# Patient Record
Sex: Male | Born: 1990 | Race: White | Hispanic: No | Marital: Single | Smoking: Never smoker
Health system: Southern US, Community
[De-identification: ages and names within clinical notes are randomized; demographics above are authoritative.]

## PROBLEM LIST (undated history)

## (undated) HISTORY — PX: SHOULDER SURGERY: SHX246

## (undated) HISTORY — PX: MASTECTOMY: SHX3

---

## 2019-11-07 ENCOUNTER — Telehealth: Payer: Self-pay | Admitting: *Deleted

## 2019-11-07 ENCOUNTER — Emergency Department (HOSPITAL_COMMUNITY): Payer: BLUE CROSS/BLUE SHIELD

## 2019-11-07 ENCOUNTER — Encounter (HOSPITAL_COMMUNITY): Payer: Self-pay

## 2019-11-07 ENCOUNTER — Other Ambulatory Visit: Payer: Self-pay

## 2019-11-07 ENCOUNTER — Emergency Department (HOSPITAL_COMMUNITY)
Admission: EM | Admit: 2019-11-07 | Discharge: 2019-11-07 | Disposition: A | Discharge status: Home / Self Care | Payer: BLUE CROSS/BLUE SHIELD | Attending: Emergency Medicine | Admitting: Emergency Medicine

## 2019-11-07 DIAGNOSIS — R7989 Other specified abnormal findings of blood chemistry: Secondary | ICD-10-CM

## 2019-11-07 DIAGNOSIS — R197 Diarrhea, unspecified: Secondary | ICD-10-CM | POA: Diagnosis not present

## 2019-11-07 DIAGNOSIS — R1084 Generalized abdominal pain: Secondary | ICD-10-CM | POA: Insufficient documentation

## 2019-11-07 DIAGNOSIS — R112 Nausea with vomiting, unspecified: Secondary | ICD-10-CM | POA: Insufficient documentation

## 2019-11-07 DIAGNOSIS — Z79899 Other long term (current) drug therapy: Secondary | ICD-10-CM | POA: Insufficient documentation

## 2019-11-07 LAB — COMPREHENSIVE METABOLIC PANEL
ALT: 55 U/L — ABNORMAL HIGH (ref 0–44)
AST: 72 U/L — ABNORMAL HIGH (ref 15–41)
Albumin: 4.7 g/dL (ref 3.5–5.0)
Alkaline Phosphatase: 53 U/L (ref 38–126)
Anion gap: 12 (ref 5–15)
BUN: 10 mg/dL (ref 6–20)
CO2: 24 mmol/L (ref 22–32)
Calcium: 9.9 mg/dL (ref 8.9–10.3)
Chloride: 99 mmol/L (ref 98–111)
Creatinine, Ser: 0.83 mg/dL (ref 0.61–1.24)
GFR calc Af Amer: >60 mL/min (ref >60)
GFR calc non Af Amer: >60 mL/min (ref >60)
Glucose, Bld: 123 mg/dL — ABNORMAL HIGH (ref 70–99)
Potassium: 4.1 mmol/L (ref 3.5–5.1)
Sodium: 135 mmol/L (ref 135–145)
Total Bilirubin: 1.2 mg/dL (ref 0.3–1.2)
Total Protein: 8 g/dL (ref 6.5–8.1)

## 2019-11-07 LAB — CBC WITH DIFFERENTIAL/PLATELET
Abs Immature Granulocytes: 0.02 10*3/uL (ref 0.00–0.07)
Basophils Absolute: 0 10*3/uL (ref 0.0–0.1)
Basophils Relative: 1 %
Eosinophils Absolute: 0.1 10*3/uL (ref 0.0–0.5)
Eosinophils Relative: 2 %
HCT: 50.6 % (ref 39.0–52.0)
Hemoglobin: 16.8 g/dL (ref 13.0–17.0)
Immature Granulocytes: 0 %
Lymphocytes Relative: 16 %
Lymphs Abs: 1.1 10*3/uL (ref 0.7–4.0)
MCH: 32.6 pg (ref 26.0–34.0)
MCHC: 33.2 g/dL (ref 30.0–36.0)
MCV: 98.3 fL (ref 80.0–100.0)
Monocytes Absolute: 0.8 10*3/uL (ref 0.1–1.0)
Monocytes Relative: 12 %
Neutro Abs: 4.8 10*3/uL (ref 1.7–7.7)
Neutrophils Relative %: 69 %
Platelets: 209 10*3/uL (ref 150–400)
RBC: 5.15 MIL/uL (ref 4.22–5.81)
RDW: 12.5 % (ref 11.5–15.5)
WBC: 6.8 10*3/uL (ref 4.0–10.5)
nRBC: 0 % (ref 0.0–0.2)

## 2019-11-07 LAB — URINALYSIS, ROUTINE W REFLEX MICROSCOPIC
Bilirubin Urine: NEGATIVE
Glucose, UA: NEGATIVE mg/dL
Hgb urine dipstick: NEGATIVE
Ketones, ur: 20 mg/dL — AB
Leukocytes,Ua: NEGATIVE
Nitrite: NEGATIVE
Protein, ur: NEGATIVE mg/dL
Specific Gravity, Urine: 1.013 (ref 1.005–1.030)
pH: 6 (ref 5.0–8.0)

## 2019-11-07 LAB — I-STAT BETA HCG BLOOD, ED (MC, WL, AP ONLY): I-stat hCG, quantitative: <5 m[IU]/mL (ref <5)

## 2019-11-07 LAB — LIPASE, BLOOD: Lipase: 44 U/L (ref 11–51)

## 2019-11-07 MED ORDER — DICYCLOMINE HCL 20 MG PO TABS: 20.0000 mg | ORAL_TABLET | Freq: Two times a day (BID) | ORAL | 0 refills | Status: AC | PRN

## 2019-11-07 MED ORDER — ONDANSETRON 4 MG PO TBDP: 4.0000 mg | ORAL_TABLET | Freq: Three times a day (TID) | ORAL | 0 refills | Status: AC | PRN

## 2019-11-07 MED ORDER — IOHEXOL 300 MG/ML  SOLN
100.0000 mL | Freq: Once | INTRAMUSCULAR | Status: AC | PRN
  Administered 2019-11-07: 100 mL via INTRAVENOUS

## 2019-11-07 MED ORDER — SODIUM CHLORIDE 0.9 % IV BOLUS
1000.0000 mL | Freq: Once | INTRAVENOUS | Status: AC
  Administered 2019-11-07: 1000 mL via INTRAVENOUS

## 2019-11-07 MED ORDER — SODIUM CHLORIDE (PF) 0.9 % IJ SOLN
INTRAMUSCULAR | Status: AC
  Filled 2019-11-07: qty 50

## 2019-11-07 MED ORDER — ACETAMINOPHEN 500 MG PO TABS: 500.0000 mg | ORAL_TABLET | Freq: Four times a day (QID) | ORAL | 0 refills | Status: AC | PRN

## 2019-11-07 NOTE — ED Notes (Signed)
Pt given gingerale and crackers 

## 2019-11-07 NOTE — ED Triage Notes (Signed)
Patient c/o intermittent right mid abdominal pain, N/v/D x 4-5 days, but worse x the past 3 days.

## 2019-11-07 NOTE — Telephone Encounter (Signed)
TOC CM received call from pt stating his Bentyl Rx was not at his pharmacy. Contacted pharmacy and they did receive RX. Call back to pt to make him aware his medications are ready. Isidoro Donning RN CCM, [facility] ED TOC CM [phone]

## 2019-11-07 NOTE — Discharge Instructions (Addendum)
Your work-up today showed some inflammation of the bowels.  Also showed some fatty liver disease.  1. Medications: You can take [phone] mg of Tylenol every 6 hours as needed for pain. Do not exceed 4000 mg of Tylenol daily.  Take Zofran as needed for nausea.  Let this medicine dissolve under the tongue and wait around 10-20 minutes before eating or drinking after taking this medication.  You can take Bentyl as needed for crampy abdominal pain. 2. Treatment: rest, drink plenty of fluids, advance diet slowly.  Start with water and broth then advance to bland foods that will not upset your stomach such as crackers, mashed potatoes, and peanut butter.  You may also find it helpful to start taking a probiotic or eating yogurt to help reculture good bacteria in the gut. 3. Follow Up: Please followup with your primary doctor in 3 days for discussion of your diagnoses and further evaluation after today's visit; if you do not have a primary care doctor use the resource guide provided to find one; Please return to the ER for persistent vomiting, high fevers or worsening symptoms

## 2019-11-07 NOTE — ED Provider Notes (Signed)
[facility]-EMERGENCY DEPT Provider Note   CSN: [id number] Arrival date & time: 11/07/19  1057     History Chief Complaint  Patient presents with  . Abdominal Pain  . Emesis  . Diarrhea    Juan Moon is a 29 y.o. trans male presents for evaluation of acute onset, persistent abdominal pain with associated nausea and vomiting for 5 days.  Reports he has had similar symptoms previously but never this severe.  Pain is intermittent midline but just off to the right upper abdomen, no aggravating or alleviating factors noted.  Pain will radiate at times to the right lower quadrant of the abdomen.  He has had multiple episodes of nonbloody nonbilious emesis over the last few days, last vomited yesterday morning.  Has been able to tolerate protein shakes since then.  Had a few episodes of softer stools but denies diarrhea, constipation, melena, or hematochezia.  Notes some mild urinary frequency but denies urgency, dysuria, or hematuria.  No fevers, chest pain, or shortness of breath.  Takes Depo testosterone and does not currently have any menstrual cycles.  No history of prior abdominal surgeries.  Took ibuprofen and Dramamine with some improvement in symptoms temporarily.  The history is provided by the patient.       History reviewed. No pertinent past medical history.  There are no problems to display for this patient.   Past Surgical History:  Procedure Laterality Date  . MASTECTOMY    . SHOULDER SURGERY         Family History  Problem Relation Age of Onset  . Hypertension Mother   . Bipolar disorder Mother     Social History   Tobacco Use  . Smoking status: Never Smoker  . Smokeless tobacco: Never Used  Substance Use Topics  . Alcohol use: Yes  . Drug use: Yes    Types: Marijuana    Home Medications Prior to Admission medications   Medication Sig Start Date End Date Taking? Authorizing Provider  buPROPion (WELLBUTRIN SR) 100 MG 12 hr  tablet Take 100 mg by mouth daily. 09/22/19  Yes [provider]  testosterone cypionate (DEPOTESTOSTERONE CYPIONATE) 200 MG/ML injection Inject 0.5 mLs into the skin once a week. 09/22/19  Yes [provider]  acetaminophen (TYLENOL) 500 MG tablet Take 1 tablet (500 mg total) by mouth every 6 (six) hours as needed. 11/07/19   Domenico Achord A, PA-C  dicyclomine (BENTYL) 20 MG tablet Take 1 tablet (20 mg total) by mouth 2 (two) times daily as needed for spasms. 11/07/19   Luevenia Maxin, Fleurette Woolbright A, PA-C  ondansetron (ZOFRAN ODT) 4 MG disintegrating tablet Take 1 tablet (4 mg total) by mouth every 8 (eight) hours as needed for nausea or vomiting. 11/07/19   Luevenia Maxin, Madicyn Mesina A, PA-C    Allergies    Amoxicillin  Review of Systems   Review of Systems  Constitutional: Negative for chills and fever.  Gastrointestinal: Positive for abdominal pain, nausea and vomiting.  Genitourinary: Positive for frequency. Negative for dysuria, hematuria and urgency.  All other systems reviewed and are negative.   Physical Exam Updated Vital Signs BP (!) 136/94   Pulse 94   Temp 98.7 F (37.1 C) (Oral)   Resp 16   Ht 5\' 7"  (1.702 m)   Wt 79.4 kg   SpO2 100%   BMI 27.41 kg/m   Physical Exam Vitals and nursing note reviewed.  Constitutional:      General: He is not in acute distress.  Appearance: He is well-developed.  HENT:     Head: Normocephalic and atraumatic.  Eyes:     General:        Right eye: No discharge.        Left eye: No discharge.     Conjunctiva/sclera: Conjunctivae normal.  Neck:     Vascular: No JVD.     Trachea: No tracheal deviation.  Cardiovascular:     Rate and Rhythm: Regular rhythm. Tachycardia present.     Heart sounds: Normal heart sounds.  Pulmonary:     Effort: Pulmonary effort is normal.     Breath sounds: Normal breath sounds.  Abdominal:     General: Bowel sounds are normal. There is no distension.     Palpations: Abdomen is soft.     Tenderness: There is no  abdominal tenderness. There is no right CVA tenderness, left CVA tenderness, guarding or rebound.  Skin:    General: Skin is warm and dry.     Findings: No erythema.  Neurological:     Mental Status: He is alert.  Psychiatric:        Behavior: Behavior normal.     ED Results / Procedures / Treatments   Labs (all labs ordered are listed, but only abnormal results are displayed) Labs Reviewed  COMPREHENSIVE METABOLIC PANEL - Abnormal; Notable for the following components:      Result Value   Glucose, Bld 123 (*)    AST 72 (*)    ALT 55 (*)    All other components within normal limits  URINALYSIS, ROUTINE W REFLEX MICROSCOPIC - Abnormal; Notable for the following components:   Ketones, ur 20 (*)    All other components within normal limits  CBC WITH DIFFERENTIAL/PLATELET  LIPASE, BLOOD  I-STAT BETA HCG BLOOD, ED ([facility], [facility], [facility] ONLY)    EKG None  Radiology CT ABDOMEN PELVIS W CONTRAST  Result Date: 11/07/2019 CLINICAL DATA:  Abdominal abscess/infection suspected Nausea/vomiting Right mid abdominal pain. EXAM: CT ABDOMEN AND PELVIS WITH CONTRAST TECHNIQUE: Multidetector CT imaging of the abdomen and pelvis was performed using the standard protocol following bolus administration of intravenous contrast. CONTRAST:  OMNIPAQUE IOHEXOL 300 MG/ML  SOLN COMPARISON:  None. FINDINGS: Lower chest: Lung bases are clear. Hepatobiliary: Borderline hepatic steatosis. No focal hepatic lesion. Gallbladder physiologically distended, no calcified stone. No biliary dilatation. Pancreas: No ductal dilatation or inflammation. Spleen: Normal in size without focal abnormality. Adrenals/Urinary Tract: Normal adrenal glands. No hydronephrosis or perinephric edema. Homogeneous renal enhancement. Urinary bladder is partially distended without wall thickening. Stomach/Bowel: Stomach is unremarkable. Normal positioning of the ligament of Treitz. Scattered fluid-filled nondilated small bowel primarily in the  lower abdomen. No definite small bowel wall thickening or inflammation. Normal appendix well visualized in the right lower quadrant. There is submucosal fatty infiltration involving the ascending, transverse, and descending colon. Possible mild pericolonic edema/wall thickening involving the proximally ascending, series 2, image 45. Small volume of stool in the sigmoid. Vascular/Lymphatic: Normal caliber abdominal aorta. Retroaortic left renal vein. The portal vein is patent. No enlarged lymph nodes in the abdomen or pelvis. Reproductive: Uterus and bilateral adnexa are unremarkable. Other: No free air, free fluid, or intra-abdominal fluid collection. Musculoskeletal: There are no acute or suspicious osseous abnormalities. IMPRESSION: 1. Submucosal fatty infiltration involving the ascending, transverse, and descending colon, suggesting chronic inflammation. Possible mild pericolonic edema/wall thickening involving the proximally ascending colon, may reflect mild colitis. 2. Scattered fluid-filled nondilated small bowel, may be normal or can be seen with  enteritis. 3. Normal appendix. 4. Borderline hepatic steatosis. Electronically Signed   By: Keith Rake M.D.   On: 11/07/2019 14:04   US Abdomen Limited RUQ  Result Date: 11/07/2019 CLINICAL DATA:  Right upper quadrant pain.  Nausea and vomiting. EXAM: ULTRASOUND ABDOMEN LIMITED RIGHT UPPER QUADRANT COMPARISON:  None. FINDINGS: Gallbladder: Physiologically distended. No gallstones or wall thickening visualized. No sonographic Murphy sign noted by sonographer. Common bile duct: Diameter: 4 mm, normal. Liver: No focal lesion identified. Within normal limits in parenchymal echogenicity. Portal vein is patent on color Doppler imaging with normal direction of blood flow towards the liver. Other: No right upper quadrant ascites. IMPRESSION: Unremarkable right upper quadrant ultrasound. Electronically Signed   By: Keith Rake M.D.   On: 11/07/2019 13:41     Procedures Procedures (including critical care time)  Medications Ordered in ED Medications  sodium chloride (PF) 0.9 % injection (0 mLs  Hold 11/07/19 1507)  iohexol (OMNIPAQUE) 300 MG/ML solution 100 mL (100 mLs Intravenous Contrast Given 11/07/19 1332)  sodium chloride 0.9 % bolus 1,000 mL (0 mLs Intravenous Stopped 11/07/19 1452)    ED Course  I have reviewed the triage vital signs and the nursing notes.  Pertinent labs & imaging results that were available during my care of the patient were reviewed by me and considered in my medical decision making (see chart for details).    MDM Rules/Calculators/A&P                      Patient presenting for evaluation of intermittent abdominal pains with nausea vomiting and diarrhea.  Patient is afebrile, initially tachycardic but vital signs otherwise stable.  He is nontoxic in appearance.  No rebound or guarding noted on examination of the abdomen and patient is entirely nontender on initial abdominal examination.  Lab work reviewed and interpreted by myself shows no leukocytosis, no anemia, no metabolic derangements, no renal insufficiency.  UA suggest some dehydration, patient was given IV fluids in the ED.  LFTs are mildly elevated but total bilirubin is within normal limits and imaging today shows no evidence of acute cholecystitis or choledocholithiasis.  CT scan does show borderline hepatic steatosis so this could explain the patient's elevated LFTs.  I informed the patient of this finding, recommended low-fat diet and recommend follow-up with PCP for reevaluation and further monitoring.   CT scan shows evidence of colitis/enteritis, no acute surgical abdominal pathology.  On reevaluation patient is resting comfortably in no apparent distress.  Serial abdominal examinations remain benign and he is tolerating sips of fluid and crackers without difficulty.  Will treat symptomatically, recommend advancing diet slowly.  Again emphasized the  importance of follow-up with PCP for reevaluation of symptoms and recheck of LFTs/hepatic steatosis.  Discussed strict ED return precautions. Patient verbalized understanding of and agreement with plan and is safe for discharge home at this time.     Final Clinical Impression(s) / ED Diagnoses Final diagnoses:  Elevated LFTs  Nausea vomiting and diarrhea  Generalized abdominal pain    Rx / DC Orders ED Discharge Orders         Ordered    ondansetron (ZOFRAN ODT) 4 MG disintegrating tablet  Every 8 hours PRN     11/07/19 1518    acetaminophen (TYLENOL) 500 MG tablet  Every 6 hours PRN     11/07/19 1518    dicyclomine (BENTYL) 20 MG tablet  2 times daily PRN     11/07/19 1518  Jeanie Sewer, PA-C 11/07/19 1603    Bethann Berkshire, MD 11/12/19 1035

## 2020-09-20 IMAGING — CT CT ABD-PELV W/ CM
2 of 4 series · 15 of 46 positions shown, 17 images · IV contrast (OMNIPAQUE)
Comparison: None.

CLINICAL DATA: Abdominal abscess/infection suspected
Nausea/vomiting

Right mid abdominal pain.
EXAM:
CT ABDOMEN AND PELVIS WITH CONTRAST
TECHNIQUE: Multidetector CT imaging of the abdomen and pelvis was performed
using the standard protocol following bolus administration of
intravenous contrast.
CONTRAST:  100mL OMNIPAQUE IOHEXOL 300 MG/ML  SOLN

[Series 2: axial st · axial · 0.78mm/px · z∈[+1031,+1456]mm · 12 of 99 slices shown, 14 images]
[im 7/99  soft-tissue]
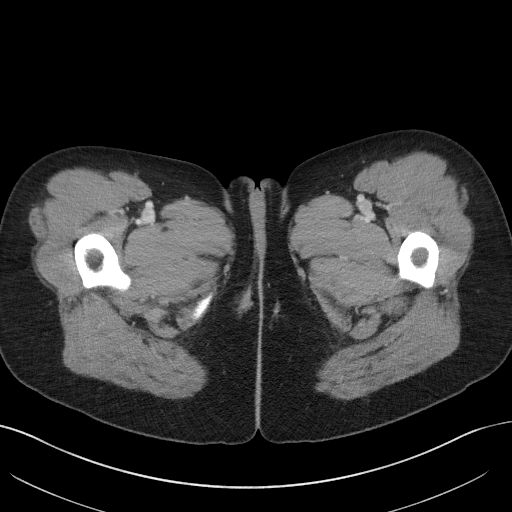
[im 7/99  bone]
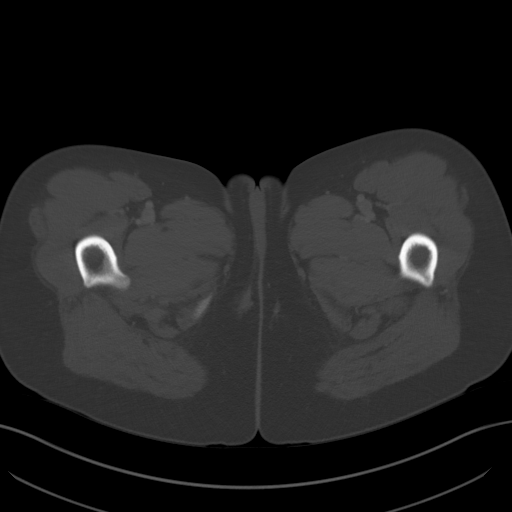
[im 13/99  soft-tissue]
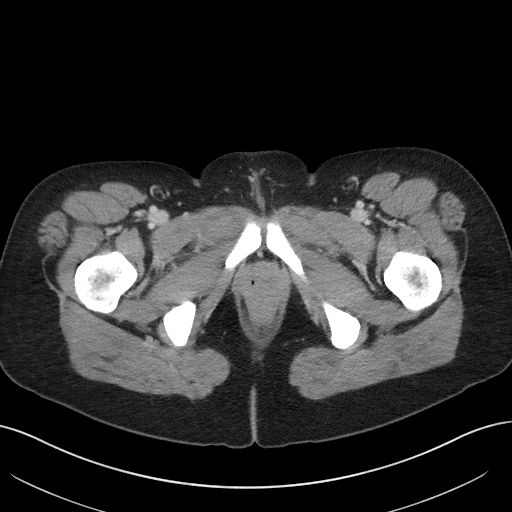
[im 25/99  soft-tissue]
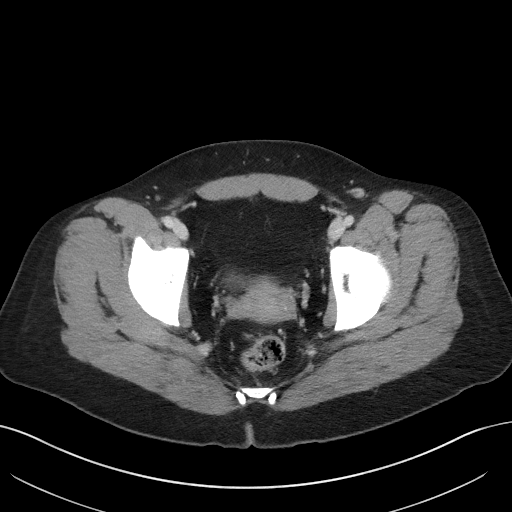
[im 31/99  soft-tissue]
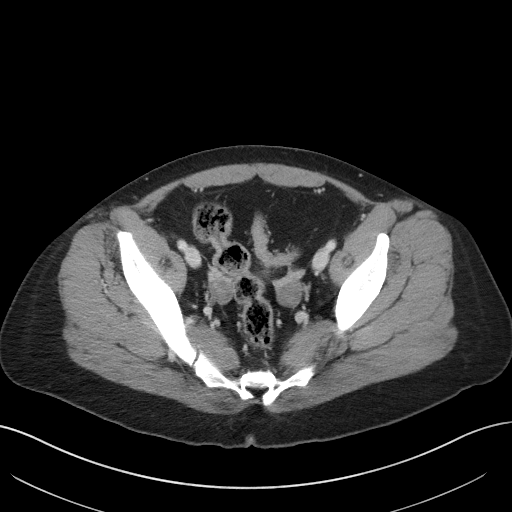
[im 37/99  soft-tissue]
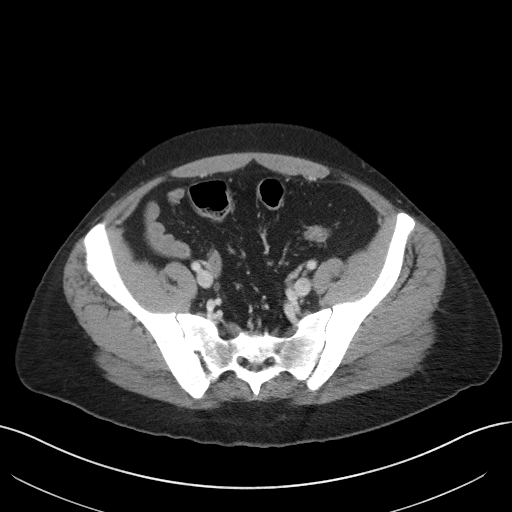
[im 43/99  soft-tissue]
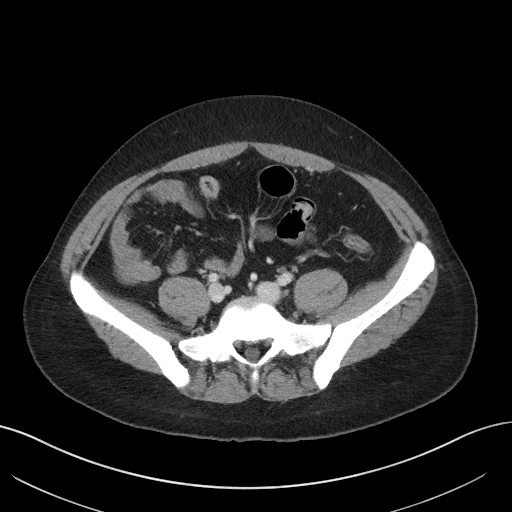
[im 56/99  soft-tissue]
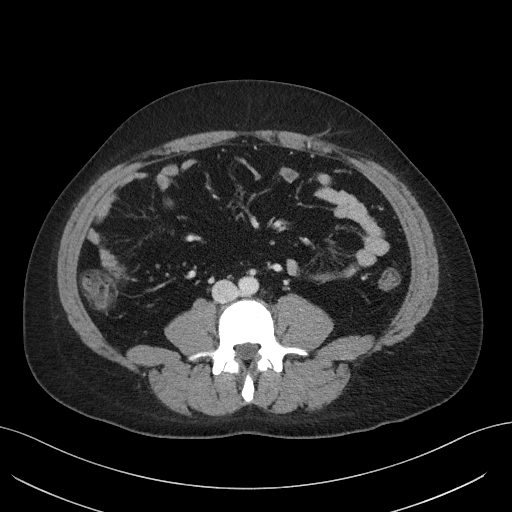
[im 62/99  soft-tissue]
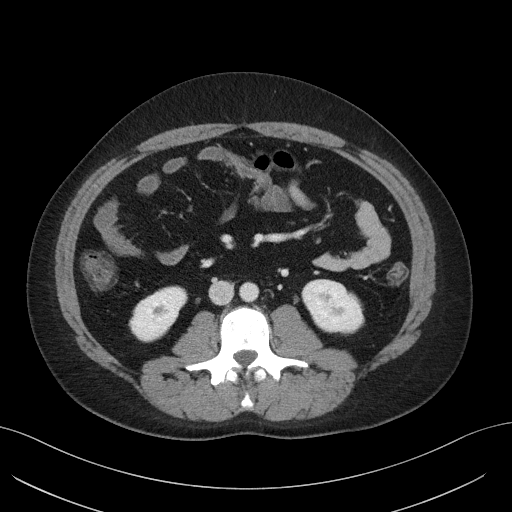
[im 68/99  soft-tissue]
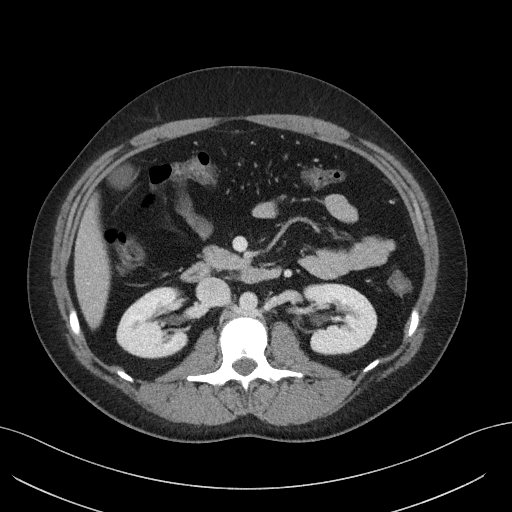
[im 68/99  bone]
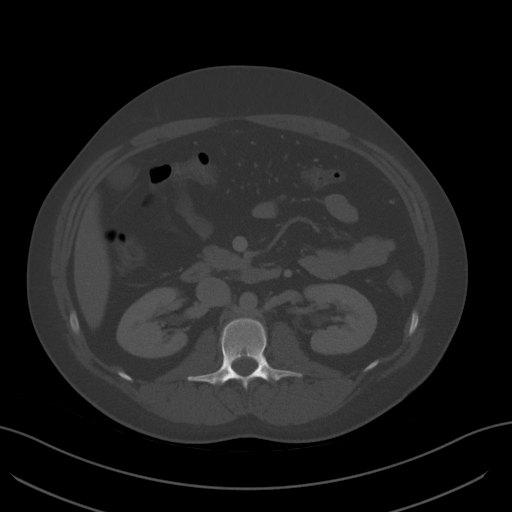
[im 74/99  soft-tissue]
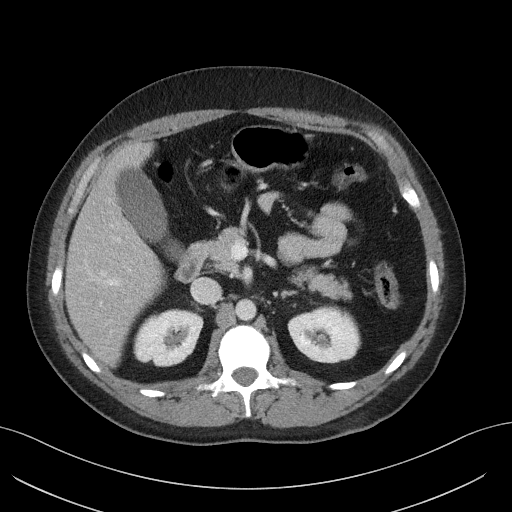
[im 86/99  soft-tissue]
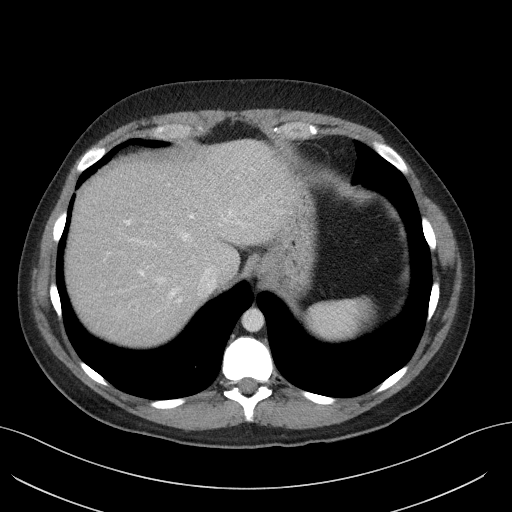
[im 92/99  soft-tissue]
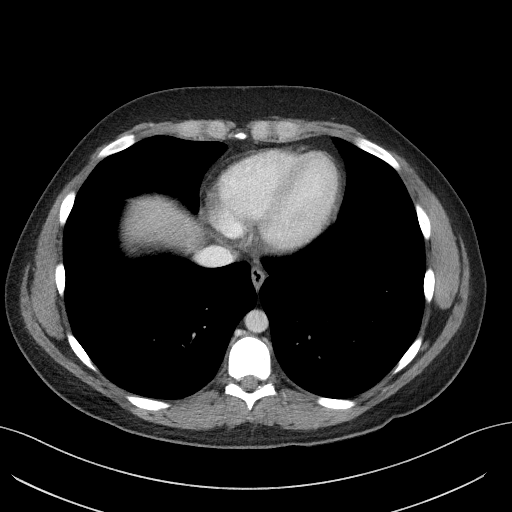

[Series 5: coronal st · coronal · 0.73mm/px · 3 of 101 slices shown]
[im 34/101  soft-tissue]
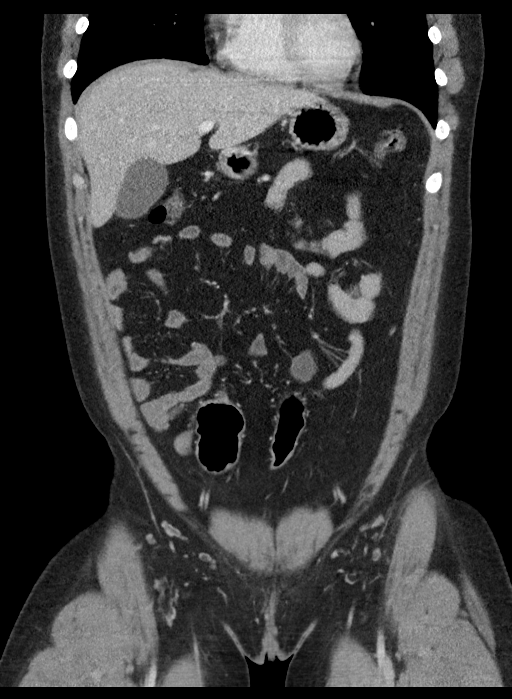
[im 45/101  soft-tissue]
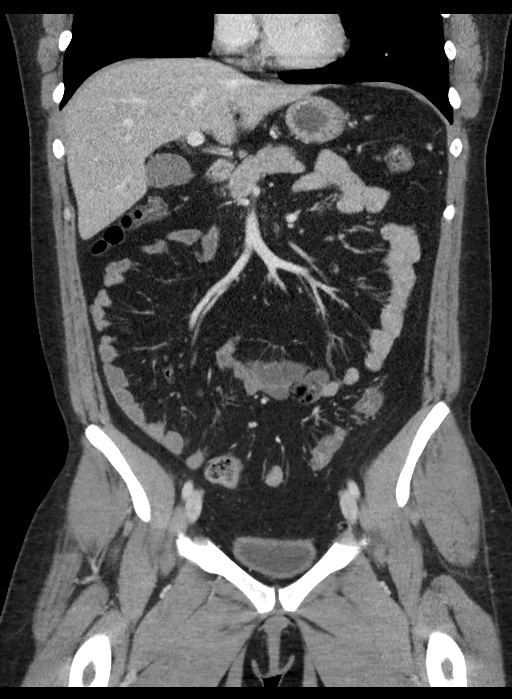
[im 56/101  soft-tissue]
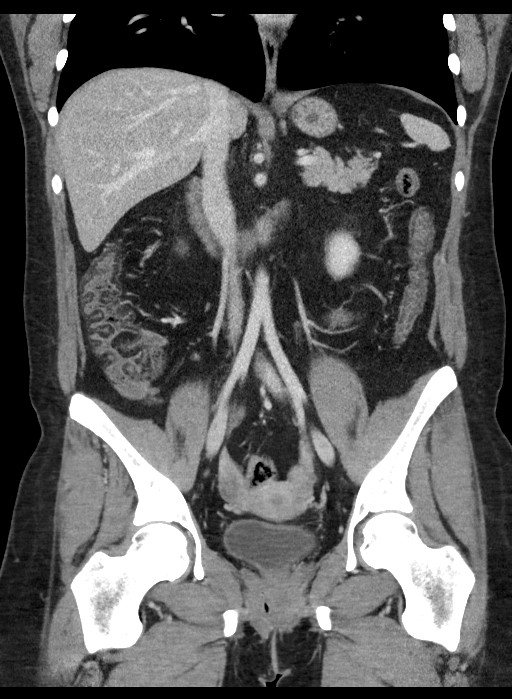

[15 of 46 positions shown; findings below may reference images not displayed]

FINDINGS: Lower chest: Lung bases are clear.

Hepatobiliary: Borderline hepatic steatosis. No focal hepatic
lesion. Gallbladder physiologically distended, no calcified stone.
No biliary dilatation.

Pancreas: No ductal dilatation or inflammation.

Spleen: Normal in size without focal abnormality.

Adrenals/Urinary Tract: Normal adrenal glands. No hydronephrosis or
perinephric edema. Homogeneous renal enhancement. Urinary bladder is
partially distended without wall thickening.

Stomach/Bowel: Stomach is unremarkable. Normal positioning of the
ligament of Treitz. Scattered fluid-filled nondilated small bowel
primarily in the lower abdomen. No definite small bowel wall
thickening or inflammation. Normal appendix well visualized in the
right lower quadrant. There is submucosal fatty infiltration
involving the ascending, transverse, and descending colon. Possible
mild pericolonic edema/wall thickening involving the proximally
ascending, series 2, image 45. Small volume of stool in the sigmoid.

Vascular/Lymphatic: Normal caliber abdominal aorta. Retroaortic left
renal vein. The portal vein is patent. No enlarged lymph nodes in
the abdomen or pelvis.

Reproductive: Uterus and bilateral adnexa are unremarkable.

Other: No free air, free fluid, or intra-abdominal fluid collection.

Musculoskeletal: There are no acute or suspicious osseous
abnormalities.
IMPRESSION: 1. Submucosal fatty infiltration involving the ascending,
transverse, and descending colon, suggesting chronic inflammation.
Possible mild pericolonic edema/wall thickening involving the
proximally ascending colon, may reflect mild colitis.
2. Scattered fluid-filled nondilated small bowel, may be normal or
can be seen with enteritis.
3. Normal appendix.
4. Borderline hepatic steatosis.

## 2020-09-20 IMAGING — US US ABDOMEN LIMITED
1 series · 14 of 25 positions shown · non-contrast
Comparison: None.

CLINICAL DATA: Right upper quadrant pain.  Nausea and vomiting.

EXAM:
ULTRASOUND ABDOMEN LIMITED RIGHT UPPER QUADRANT

[Series 1: us abdomen limited · 14 of 50 slices shown]
[im 1/50]
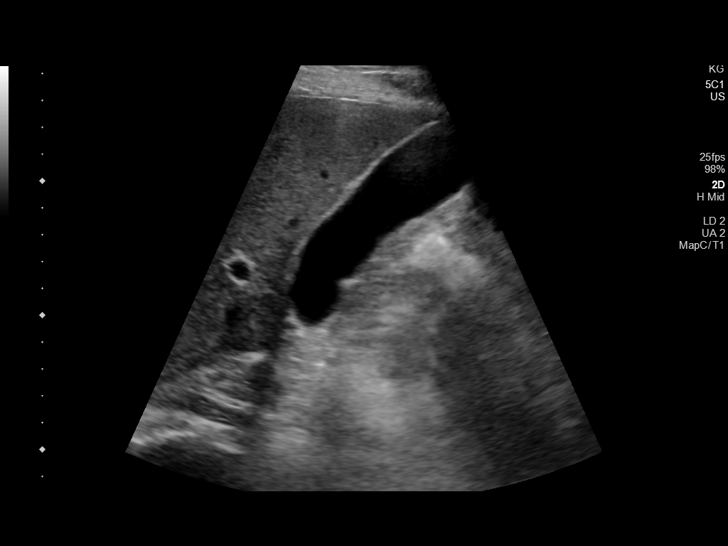
[im 5/50]
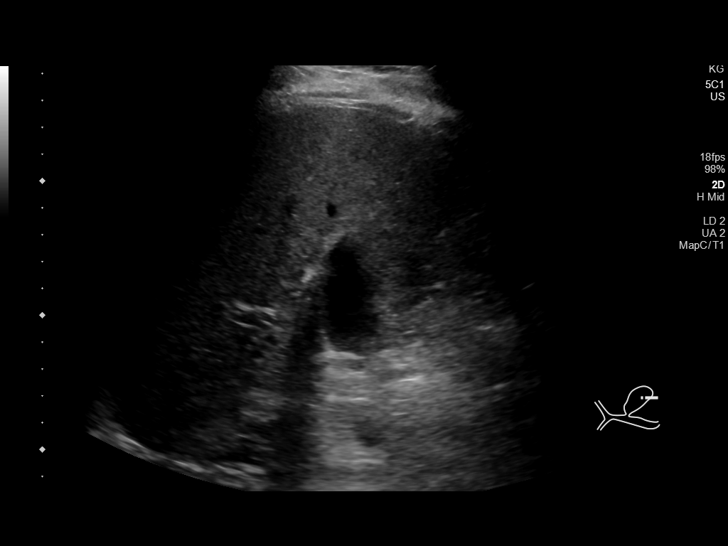
[im 9/50]
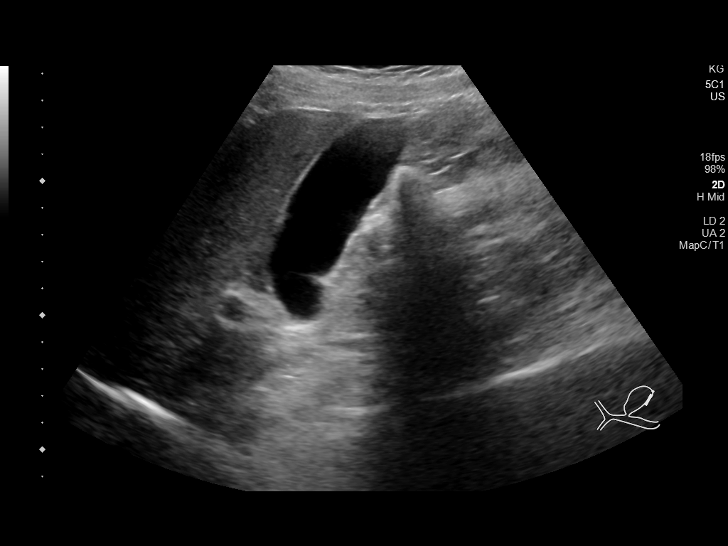
[im 13/50]
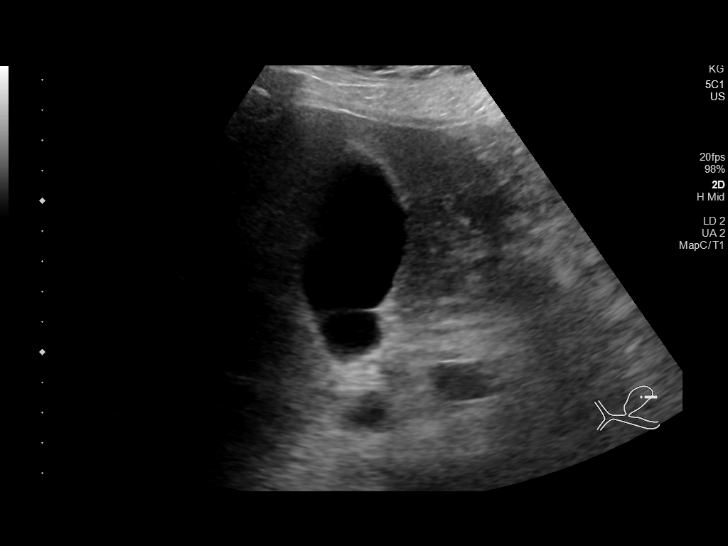
[im 17/50]
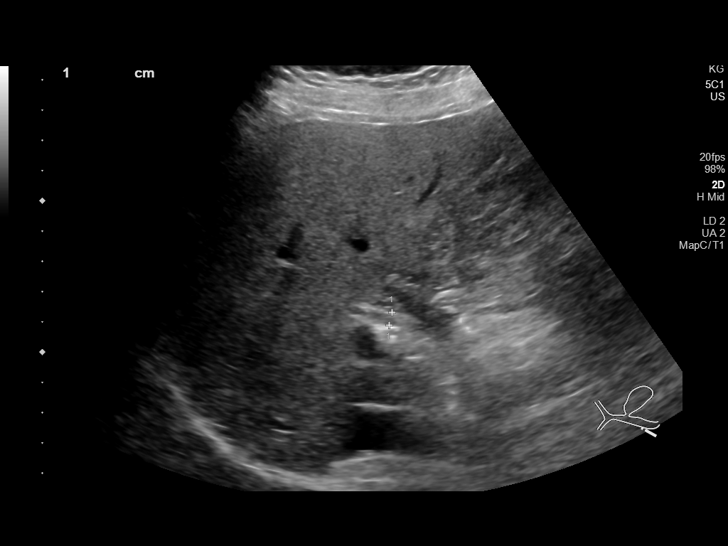
[im 19/50]
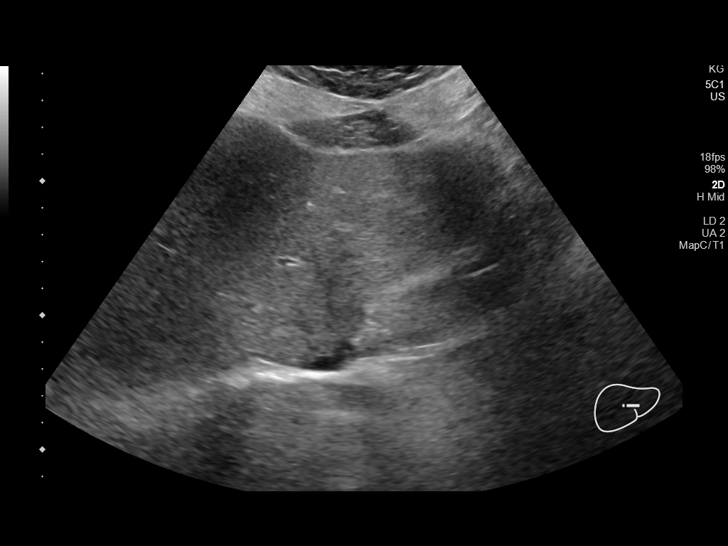
[im 23/50]
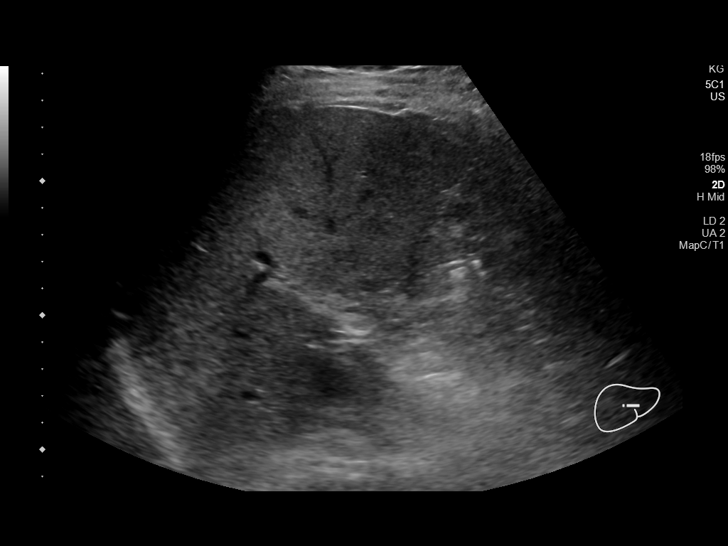
[im 27/50]
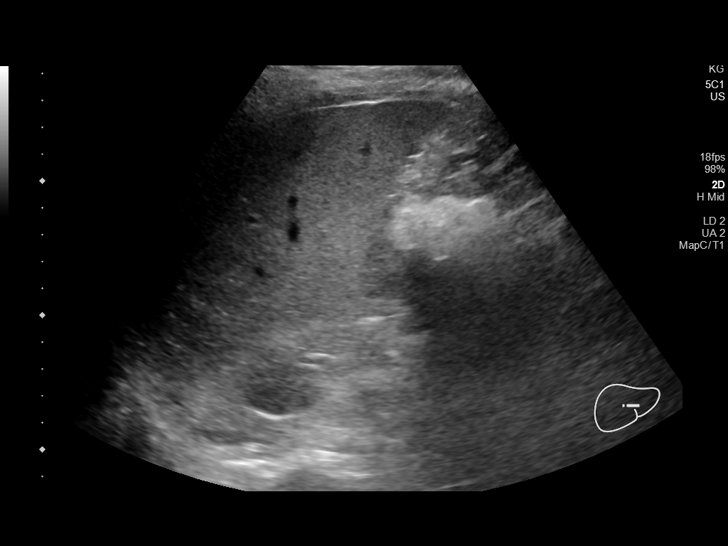
[im 31/50]
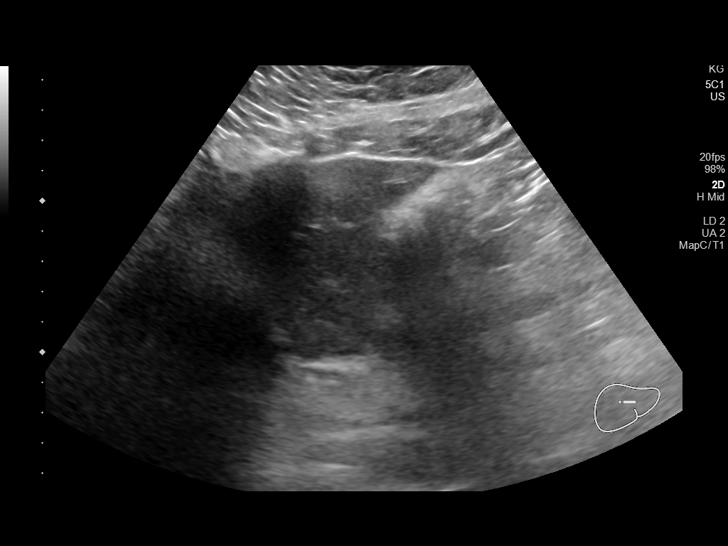
[im 33/50]
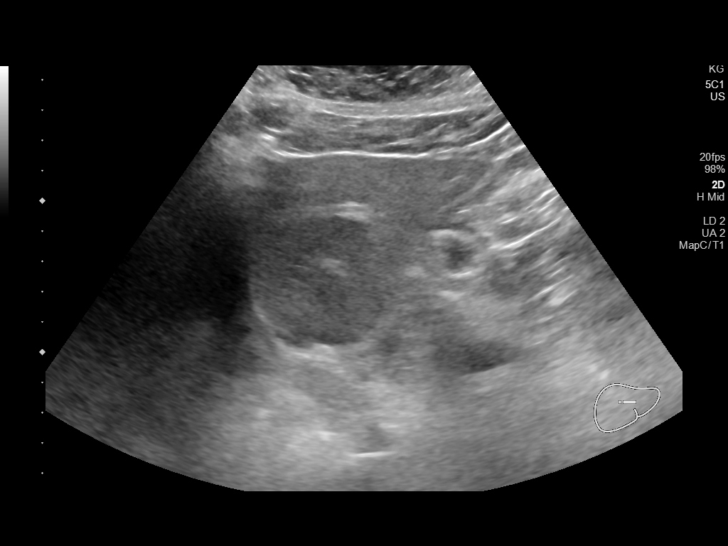
[im 37/50]
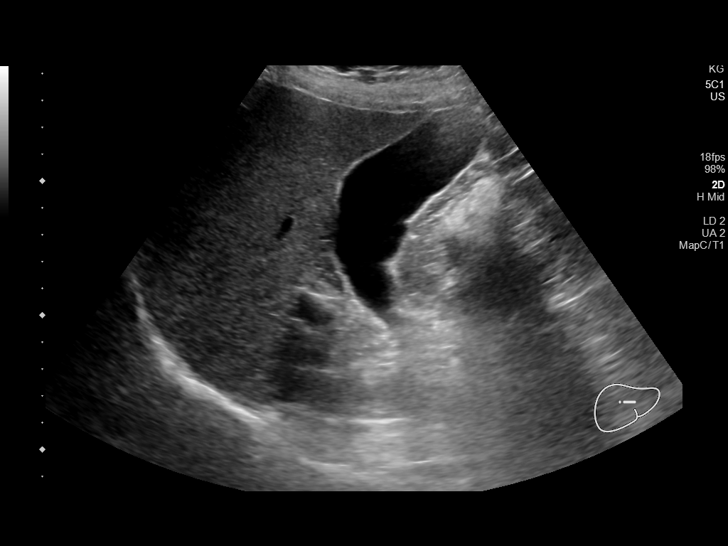
[im 41/50]
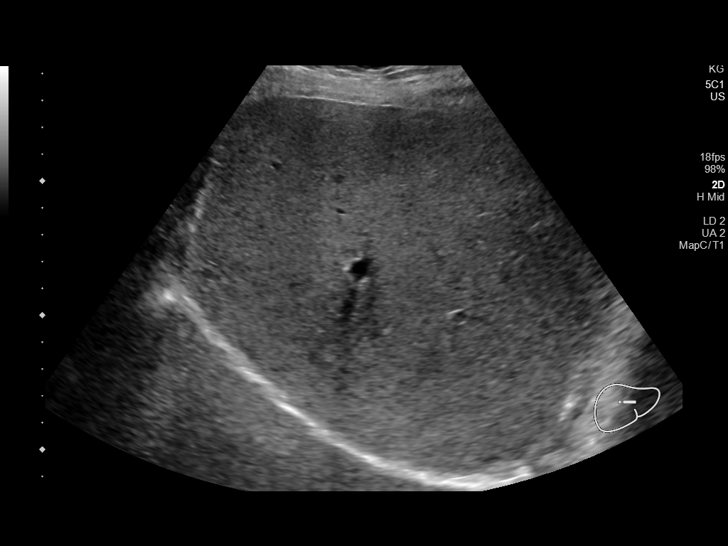
[im 45/50]
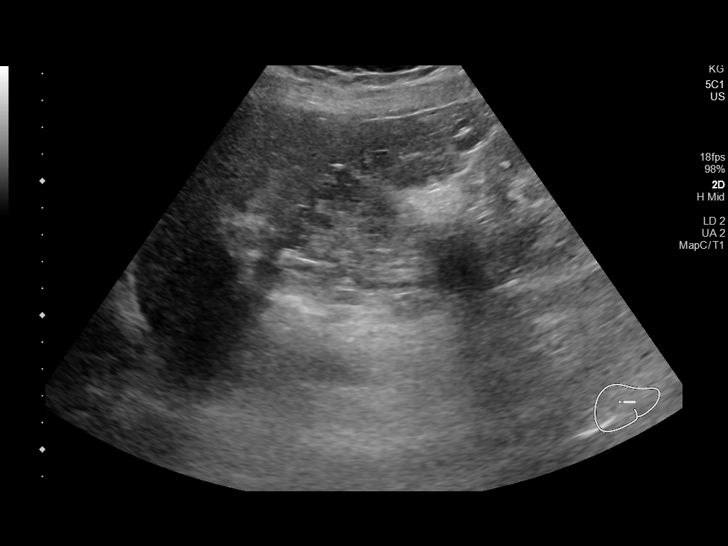
[im 50/50]
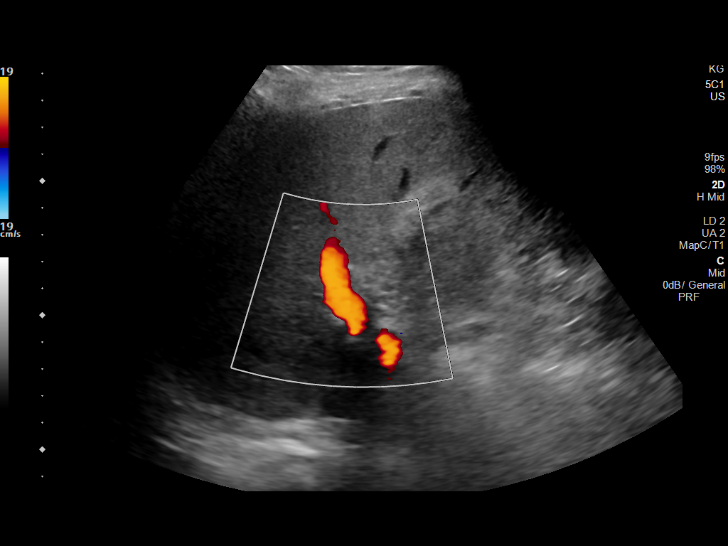

[14 of 25 positions shown; findings below may reference images not displayed]

FINDINGS: Gallbladder:

Physiologically distended. No gallstones or wall thickening
visualized. No sonographic Murphy sign noted by sonographer.

Common bile duct:

Diameter: 4 mm, normal.

Liver:

No focal lesion identified. Within normal limits in parenchymal
echogenicity. Portal vein is patent on color Doppler imaging with
normal direction of blood flow towards the liver.

Other: No right upper quadrant ascites.
IMPRESSION: Unremarkable right upper quadrant ultrasound.
